# Patient Record
Sex: Male | Born: 1970 | Hispanic: No | Marital: Married | Smoking: Never smoker
Health system: Southern US, Community
[De-identification: ages and names within clinical notes are randomized; demographics above are authoritative.]

## PROBLEM LIST (undated history)

## (undated) DIAGNOSIS — T7840XA Allergy, unspecified, initial encounter: Secondary | ICD-10-CM

## (undated) DIAGNOSIS — Z8619 Personal history of other infectious and parasitic diseases: Secondary | ICD-10-CM

## (undated) HISTORY — PX: TONSILLECTOMY AND ADENOIDECTOMY: SHX28

## (undated) HISTORY — DX: Personal history of other infectious and parasitic diseases: Z86.19

## (undated) HISTORY — DX: Allergy, unspecified, initial encounter: T78.40XA

---

## 2002-03-24 ENCOUNTER — Ambulatory Visit (HOSPITAL_COMMUNITY): Admission: RE | Admit: 2002-03-24 | Discharge: 2002-03-24 | Payer: Self-pay | Admitting: General Surgery

## 2004-03-19 HISTORY — PX: HERNIA REPAIR: SHX51

## 2015-11-18 DIAGNOSIS — L918 Other hypertrophic disorders of the skin: Secondary | ICD-10-CM | POA: Insufficient documentation

## 2015-11-18 DIAGNOSIS — D239 Other benign neoplasm of skin, unspecified: Secondary | ICD-10-CM | POA: Insufficient documentation

## 2015-11-18 DIAGNOSIS — D1800 Hemangioma unspecified site: Secondary | ICD-10-CM | POA: Insufficient documentation

## 2017-03-04 ENCOUNTER — Other Ambulatory Visit: Payer: Self-pay

## 2017-03-04 ENCOUNTER — Ambulatory Visit (INDEPENDENT_AMBULATORY_CARE_PROVIDER_SITE_OTHER): Payer: BLUE CROSS/BLUE SHIELD | Admitting: Physician Assistant

## 2017-03-04 ENCOUNTER — Encounter: Payer: Self-pay | Admitting: Physician Assistant

## 2017-03-04 VITALS — BP 138/80 | HR 74 | Temp 98.3°F | Resp 14 | Ht 70.5 in | Wt 243.0 lb

## 2017-03-04 DIAGNOSIS — E669 Obesity, unspecified: Secondary | ICD-10-CM

## 2017-03-04 DIAGNOSIS — Z0000 Encounter for general adult medical examination without abnormal findings: Secondary | ICD-10-CM | POA: Diagnosis not present

## 2017-03-04 DIAGNOSIS — Z23 Encounter for immunization: Secondary | ICD-10-CM

## 2017-03-04 DIAGNOSIS — L853 Xerosis cutis: Secondary | ICD-10-CM | POA: Diagnosis not present

## 2017-03-04 LAB — LDL CHOLESTEROL, DIRECT: LDL DIRECT: 145 mg/dL

## 2017-03-04 LAB — CBC WITH DIFFERENTIAL/PLATELET
Basophils Absolute: 0 10*3/uL (ref 0.0–0.1)
Basophils Relative: 0.5 % (ref 0.0–3.0)
EOS ABS: 0.2 10*3/uL (ref 0.0–0.7)
Eosinophils Relative: 2.9 % (ref 0.0–5.0)
HEMATOCRIT: 44.6 % (ref 39.0–52.0)
HEMOGLOBIN: 15.2 g/dL (ref 13.0–17.0)
LYMPHS PCT: 32 % (ref 12.0–46.0)
Lymphs Abs: 2.5 10*3/uL (ref 0.7–4.0)
MCHC: 34.1 g/dL (ref 30.0–36.0)
MCV: 90.3 fl (ref 78.0–100.0)
MONO ABS: 0.6 10*3/uL (ref 0.1–1.0)
Monocytes Relative: 7.1 % (ref 3.0–12.0)
Neutro Abs: 4.6 10*3/uL (ref 1.4–7.7)
Neutrophils Relative %: 57.5 % (ref 43.0–77.0)
Platelets: 276 10*3/uL (ref 150.0–400.0)
RBC: 4.94 Mil/uL (ref 4.22–5.81)
RDW: 13 % (ref 11.5–15.5)
WBC: 7.9 10*3/uL (ref 4.0–10.5)

## 2017-03-04 LAB — LIPID PANEL
CHOL/HDL RATIO: 6
CHOLESTEROL: 225 mg/dL — AB (ref 0–200)
HDL: 39.9 mg/dL (ref >39.00)
NonHDL: 185.45
Triglycerides: 335 mg/dL — ABNORMAL HIGH (ref 0.0–149.0)
VLDL: 67 mg/dL — ABNORMAL HIGH (ref 0.0–40.0)

## 2017-03-04 LAB — HEMOGLOBIN A1C: Hgb A1c MFr Bld: 5.3 % (ref 4.6–6.5)

## 2017-03-04 LAB — COMPREHENSIVE METABOLIC PANEL
ALBUMIN: 4.9 g/dL (ref 3.5–5.2)
ALK PHOS: 58 U/L (ref 39–117)
ALT: 31 U/L (ref 0–53)
AST: 18 U/L (ref 0–37)
BILIRUBIN TOTAL: 0.7 mg/dL (ref 0.2–1.2)
BUN: 14 mg/dL (ref 6–23)
CO2: 30 mEq/L (ref 19–32)
CREATININE: 0.86 mg/dL (ref 0.40–1.50)
Calcium: 9.5 mg/dL (ref 8.4–10.5)
Chloride: 102 mEq/L (ref 96–112)
GFR: 101.71 mL/min (ref >60.00)
Glucose, Bld: 88 mg/dL (ref 70–99)
Potassium: 4.1 mEq/L (ref 3.5–5.1)
Sodium: 139 mEq/L (ref 135–145)
TOTAL PROTEIN: 7.6 g/dL (ref 6.0–8.3)

## 2017-03-04 MED ORDER — TRIAMCINOLONE ACETONIDE 0.1 % EX CREA: 1.0000 "application " | TOPICAL_CREAM | Freq: Two times a day (BID) | CUTANEOUS | 0 refills | Status: DC

## 2017-03-04 NOTE — Assessment & Plan Note (Signed)
Flu shot given today

## 2017-03-04 NOTE — Progress Notes (Signed)
Patient presents to clinic today to establish care. Patient requesting CPE today. Is fasting for labs.  Acute Concerns: Endorses a patch of dry and scaling skin of left cheek x 2-3 months.  Denies flaking. Denies other skin lesions. Cortisone cream helps ease it off. Denies personal history of skin cancer.   Health Maintenance: Immunizations -- Unsure of last Tetanus. Agrees to flu shot today.  HIV Screening -- Denies concern today. Declines screening.   Past Medical History:  Diagnosis Date  . Allergy   . History of chickenpox     Past Surgical History:  Procedure Laterality Date  . HERNIA REPAIR  2006  . TONSILLECTOMY AND ADENOIDECTOMY      No current outpatient medications on file prior to visit.   No current facility-administered medications on file prior to visit.     No Known Allergies  Family History  Problem Relation Age of Onset  . Depression Mother   . Cancer Mother        Skin - SCC  . Arthritis Father   . Heart disease Father   . Hyperlipidemia Father   . Hypertension Father   . Lupus Paternal Aunt   . Lung cancer Other        Paternal Grandmother    Social History   Socioeconomic History  . Marital status: Married    Spouse name: Jinny Blossom  . Number of children: 3  . Years of education: Not on file  . Highest education level: Not on file  Social Needs  . Financial resource strain: Not on file  . Food insecurity - worry: Not on file  . Food insecurity - inability: Not on file  . Transportation needs - medical: Not on file  . Transportation needs - non-medical: Not on file  Occupational History  . Occupation: Facilities manager  Tobacco Use  . Smoking status: Never Smoker  . Smokeless tobacco: Never Used  Substance and Sexual Activity  . Alcohol use: Yes    Comment: rarely  . Drug use: No  . Sexual activity: Yes  Other Topics Concern  . Not on file  Social History Narrative  . Not on file   Review of Systems  Constitutional: Negative  for fever and weight loss.  HENT: Negative for ear discharge, ear pain, hearing loss and tinnitus.   Eyes: Negative for blurred vision, double vision, photophobia and pain.  Respiratory: Negative for cough and shortness of breath.   Cardiovascular: Negative for chest pain and palpitations.  Gastrointestinal: Negative for abdominal pain, blood in stool, constipation, diarrhea, heartburn, melena, nausea and vomiting.  Genitourinary: Negative for dysuria, flank pain, frequency, hematuria and urgency.  Musculoskeletal: Negative for falls.  Neurological: Negative for dizziness, loss of consciousness and headaches.  Endo/Heme/Allergies: Negative for environmental allergies.  Psychiatric/Behavioral: Negative for depression, hallucinations, substance abuse and suicidal ideas. The patient is not nervous/anxious and does not have insomnia.    BP 138/80   Pulse 74   Temp 98.3 F (36.8 C) (Oral)   Resp 14   Ht 5' 10.5" (1.791 m)   Wt 243 lb (110.2 kg)   SpO2 97%   BMI 34.37 kg/m   Physical Exam  Constitutional: He is oriented to person, place, and time and well-developed, well-nourished, and in no distress.  HENT:  Head: Normocephalic and atraumatic.  Right Ear: External ear normal.  Left Ear: External ear normal.  Nose: Nose normal.  Mouth/Throat: Oropharynx is clear and moist. No oropharyngeal exudate.  Eyes: Conjunctivae and  EOM are normal. Pupils are equal, round, and reactive to light.  Neck: Neck supple. No thyromegaly present.  Cardiovascular: Normal rate, regular rhythm, normal heart sounds and intact distal pulses.  Pulmonary/Chest: Effort normal and breath sounds normal. No respiratory distress. He has no wheezes. He has no rales. He exhibits no tenderness.  Abdominal: Soft. Bowel sounds are normal. He exhibits no distension and no mass. There is no tenderness. There is no rebound and no guarding.  Genitourinary: Testes/scrotum normal and penis normal. No discharge found.    Lymphadenopathy:    He has no cervical adenopathy.  Neurological: He is alert and oriented to person, place, and time. No cranial nerve deficit.  Skin: Skin is warm and dry. No rash noted.     Psychiatric: Affect normal.  Vitals reviewed.  Assessment/Plan: Visit for preventive health examination Depression screen negative. Health Maintenance reviewed. Preventive schedule discussed and handout given in AVS. Will obtain fasting labs today.   Obesity (BMI 30.0-34.9) Body mass index is 34.37 kg/m. Discussed exercise and dietary recommendations. Will monitor.  Need for immunization against influenza Flu shot given today.  Dry skin OTC medications reviewed. Start Kenalog as directed. Derm if not resolving.    Leeanne Rio, PA-C

## 2017-03-04 NOTE — Assessment & Plan Note (Signed)
Body mass index is 34.37 kg/m. Discussed exercise and dietary recommendations. Will monitor.

## 2017-03-04 NOTE — Patient Instructions (Signed)
Please go to the lab for blood work.   Our office will call you with your results unless you have chosen to receive results via MyChart.  If your blood work is normal we will follow-up each year for physicals and as scheduled for chronic medical problems.  If anything is abnormal we will treat accordingly and get you in for a follow-up.  Start the Kenalog cream twice daily for up to 2 weeks. Get a good facial moisturizer and use daily. If spot is not clearing up, let me know as I would want you to see a Dermatologist.    Preventive Care 40-64 Years, Male Preventive care refers to lifestyle choices and visits with your health care provider that can promote health and wellness. What does preventive care include?  A yearly physical exam. This is also called an annual well check.  Dental exams once or twice a year.  Routine eye exams. Ask your health care provider how often you should have your eyes checked.  Personal lifestyle choices, including: ? Daily care of your teeth and gums. ? Regular physical activity. ? Eating a healthy diet. ? Avoiding tobacco and drug use. ? Limiting alcohol use. ? Practicing safe sex. ? Taking low-dose aspirin every day starting at age 21. What happens during an annual well check? The services and screenings done by your health care provider during your annual well check will depend on your age, overall health, lifestyle risk factors, and family history of disease. Counseling Your health care provider may ask you questions about your:  Alcohol use.  Tobacco use.  Drug use.  Emotional well-being.  Home and relationship well-being.  Sexual activity.  Eating habits.  Work and work Statistician.  Screening You may have the following tests or measurements:  Height, weight, and BMI.  Blood pressure.  Lipid and cholesterol levels. These may be checked every 5 years, or more frequently if you are over 72 years old.  Skin check.  Lung  cancer screening. You may have this screening every year starting at age 6 if you have a 30-pack-year history of smoking and currently smoke or have quit within the past 15 years.  Fecal occult blood test (FOBT) of the stool. You may have this test every year starting at age 68.  Flexible sigmoidoscopy or colonoscopy. You may have a sigmoidoscopy every 5 years or a colonoscopy every 10 years starting at age 75.  Prostate cancer screening. Recommendations will vary depending on your family history and other risks.  Hepatitis C blood test.  Hepatitis B blood test.  Sexually transmitted disease (STD) testing.  Diabetes screening. This is done by checking your blood sugar (glucose) after you have not eaten for a while (fasting). You may have this done every 1-3 years.  Discuss your test results, treatment options, and if necessary, the need for more tests with your health care provider. Vaccines Your health care provider may recommend certain vaccines, such as:  Influenza vaccine. This is recommended every year.  Tetanus, diphtheria, and acellular pertussis (Tdap, Td) vaccine. You may need a Td booster every 10 years.  Varicella vaccine. You may need this if you have not been vaccinated.  Zoster vaccine. You may need this after age 24.  Measles, mumps, and rubella (MMR) vaccine. You may need at least one dose of MMR if you were born in 1957 or later. You may also need a second dose.  Pneumococcal 13-valent conjugate (PCV13) vaccine. You may need this if you have certain  conditions and have not been vaccinated.  Pneumococcal polysaccharide (PPSV23) vaccine. You may need one or two doses if you smoke cigarettes or if you have certain conditions.  Meningococcal vaccine. You may need this if you have certain conditions.  Hepatitis A vaccine. You may need this if you have certain conditions or if you travel or work in places where you may be exposed to hepatitis A.  Hepatitis B vaccine.  You may need this if you have certain conditions or if you travel or work in places where you may be exposed to hepatitis B.  Haemophilus influenzae type b (Hib) vaccine. You may need this if you have certain risk factors.  Talk to your health care provider about which screenings and vaccines you need and how often you need them. This information is not intended to replace advice given to you by your health care provider. Make sure you discuss any questions you have with your health care provider. Document Released: 04/01/2015 Document Revised: 11/23/2015 Document Reviewed: 01/04/2015 Elsevier Interactive Patient Education  2017 [employer]. .

## 2017-03-04 NOTE — Assessment & Plan Note (Signed)
Depression screen negative. Health Maintenance reviewed. Preventive schedule discussed and handout given in AVS. Will obtain fasting labs today.  

## 2017-03-04 NOTE — Assessment & Plan Note (Signed)
OTC medications reviewed. Start Kenalog as directed. Derm if not resolving.

## 2017-03-30 ENCOUNTER — Ambulatory Visit (INDEPENDENT_AMBULATORY_CARE_PROVIDER_SITE_OTHER): Payer: Self-pay | Admitting: Family

## 2017-03-30 ENCOUNTER — Encounter: Payer: Self-pay | Admitting: Family

## 2017-03-30 VITALS — BP 136/82 | HR 74 | Temp 98.4°F | Resp 18 | Wt 239.6 lb

## 2017-03-30 DIAGNOSIS — J029 Acute pharyngitis, unspecified: Secondary | ICD-10-CM

## 2017-03-30 LAB — POCT RAPID STREP A (OFFICE): Rapid Strep A Screen: NEGATIVE

## 2017-03-30 MED ORDER — AZITHROMYCIN 250 MG PO TABS: ORAL_TABLET | ORAL | 0 refills | Status: DC

## 2017-03-30 NOTE — Progress Notes (Signed)
Subjective:     Patient ID: Brian Sellers, male   DOB: 1970-04-07, 47 y.o.   MRN: [id number]  HPI 47 year old male is in today with c/o persistent sore throat since doing a [facility] visit on 03/19/17. He continues to take Ibuprofen every 4-hours for relief. Has minimal drainage and congestion. No fever.   Review of Systems  Constitutional: Negative.   HENT: Positive for congestion, postnasal drip and sore throat.   Respiratory: Positive for cough. Negative for chest tightness, shortness of breath and wheezing.   Cardiovascular: Negative.   Musculoskeletal: Negative.   Skin: Negative.   Allergic/Immunologic: Negative.   Neurological: Negative.   Psychiatric/Behavioral: Negative.    Past Medical History:  Diagnosis Date  . Allergy   . History of chickenpox     Social History   Socioeconomic History  . Marital status: Married    Spouse name: Jinny Blossom  . Number of children: 3  . Years of education: Not on file  . Highest education level: Not on file  Social Needs  . Financial resource strain: Not on file  . Food insecurity - worry: Not on file  . Food insecurity - inability: Not on file  . Transportation needs - medical: Not on file  . Transportation needs - non-medical: Not on file  Occupational History  . Occupation: Facilities manager  Tobacco Use  . Smoking status: Never Smoker  . Smokeless tobacco: Never Used  Substance and Sexual Activity  . Alcohol use: Yes    Comment: rarely  . Drug use: No  . Sexual activity: Yes  Other Topics Concern  . Not on file  Social History Narrative  . Not on file    Past Surgical History:  Procedure Laterality Date  . HERNIA REPAIR  2006  . TONSILLECTOMY AND ADENOIDECTOMY      Family History  Problem Relation Age of Onset  . Depression Mother   . Cancer Mother        Skin - SCC  . Arthritis Father   . Heart disease Father   . Hyperlipidemia Father   . Hypertension Father   . Lupus Paternal Aunt   . Lung cancer Other    Paternal Grandmother    No Known Allergies  Current Outpatient Medications on File Prior to Visit  Medication Sig Dispense Refill  . triamcinolone cream (KENALOG) 0.1 % Apply 1 application topically 2 (two) times daily. For 2 weeks. 30 g 0   No current facility-administered medications on file prior to visit.     BP 136/82 (BP Location: Right Arm, Patient Position: Sitting, Cuff Size: Large)   Pulse 74   Temp 98.4 F (36.9 C) (Oral)   Resp 18   Wt 239 lb 9.6 oz (108.7 kg)   SpO2 97%   BMI 33.89 kg/m chart    Objective:   Physical Exam     Assessment:     Brian Sellers was seen today for sore throat.  Diagnoses and all orders for this visit:  Pharyngitis, unspecified etiology -     POCT rapid strep A  Other orders -     azithromycin (ZITHROMAX) 250 MG tablet; 2 tabs today then 1 tab daily x 4 more days      Plan:     OTC decongestant. Call the office with any questions or concerns.

## 2017-03-30 NOTE — Patient Instructions (Signed)

## 2017-04-01 ENCOUNTER — Telehealth: Payer: Self-pay

## 2017-04-01 NOTE — Telephone Encounter (Signed)
Patients states he is great.

## 2019-03-24 ENCOUNTER — Emergency Department (HOSPITAL_BASED_OUTPATIENT_CLINIC_OR_DEPARTMENT_OTHER): Payer: No Typology Code available for payment source

## 2019-03-24 ENCOUNTER — Other Ambulatory Visit: Payer: Self-pay

## 2019-03-24 ENCOUNTER — Encounter (HOSPITAL_BASED_OUTPATIENT_CLINIC_OR_DEPARTMENT_OTHER): Payer: Self-pay | Admitting: Emergency Medicine

## 2019-03-24 ENCOUNTER — Emergency Department (HOSPITAL_BASED_OUTPATIENT_CLINIC_OR_DEPARTMENT_OTHER)
Admission: EM | Admit: 2019-03-24 | Discharge: 2019-03-24 | Disposition: A | Discharge status: Home / Self Care | Payer: No Typology Code available for payment source | Attending: Emergency Medicine | Admitting: Emergency Medicine

## 2019-03-24 DIAGNOSIS — R1032 Left lower quadrant pain: Secondary | ICD-10-CM | POA: Diagnosis present

## 2019-03-24 DIAGNOSIS — N200 Calculus of kidney: Secondary | ICD-10-CM | POA: Insufficient documentation

## 2019-03-24 LAB — BASIC METABOLIC PANEL
Anion gap: 12 (ref 5–15)
BUN: 22 mg/dL — ABNORMAL HIGH (ref 6–20)
CO2: 23 mmol/L (ref 22–32)
Calcium: 9.4 mg/dL (ref 8.9–10.3)
Chloride: 105 mmol/L (ref 98–111)
Creatinine, Ser: 0.94 mg/dL (ref 0.61–1.24)
GFR calc Af Amer: >60 mL/min (ref >60)
GFR calc non Af Amer: >60 mL/min (ref >60)
Glucose, Bld: 132 mg/dL — ABNORMAL HIGH (ref 70–99)
Potassium: 4 mmol/L (ref 3.5–5.1)
Sodium: 140 mmol/L (ref 135–145)

## 2019-03-24 LAB — URINALYSIS, ROUTINE W REFLEX MICROSCOPIC
Bilirubin Urine: NEGATIVE
Glucose, UA: NEGATIVE mg/dL
Ketones, ur: NEGATIVE mg/dL
Leukocytes,Ua: NEGATIVE
Nitrite: NEGATIVE
Protein, ur: NEGATIVE mg/dL
Specific Gravity, Urine: >1.03 — ABNORMAL HIGH (ref 1.005–1.030)
pH: 5.5 (ref 5.0–8.0)

## 2019-03-24 LAB — CBC WITH DIFFERENTIAL/PLATELET
Abs Immature Granulocytes: 0.05 10*3/uL (ref 0.00–0.07)
Basophils Absolute: 0 10*3/uL (ref 0.0–0.1)
Basophils Relative: 0 %
Eosinophils Absolute: 0.1 10*3/uL (ref 0.0–0.5)
Eosinophils Relative: 1 %
HCT: 44.9 % (ref 39.0–52.0)
Hemoglobin: 15.4 g/dL (ref 13.0–17.0)
Immature Granulocytes: 0 %
Lymphocytes Relative: 16 %
Lymphs Abs: 2 10*3/uL (ref 0.7–4.0)
MCH: 30.4 pg (ref 26.0–34.0)
MCHC: 34.3 g/dL (ref 30.0–36.0)
MCV: 88.7 fL (ref 80.0–100.0)
Monocytes Absolute: 0.8 10*3/uL (ref 0.1–1.0)
Monocytes Relative: 7 %
Neutro Abs: 9.2 10*3/uL — ABNORMAL HIGH (ref 1.7–7.7)
Neutrophils Relative %: 76 %
Platelets: 293 10*3/uL (ref 150–400)
RBC: 5.06 MIL/uL (ref 4.22–5.81)
RDW: 12.3 % (ref 11.5–15.5)
WBC: 12.2 10*3/uL — ABNORMAL HIGH (ref 4.0–10.5)
nRBC: 0 % (ref 0.0–0.2)

## 2019-03-24 LAB — URINALYSIS, MICROSCOPIC (REFLEX)

## 2019-03-24 MED ORDER — HYDROCODONE-ACETAMINOPHEN 5-325 MG PO TABS: 1.0000 | ORAL_TABLET | Freq: Four times a day (QID) | ORAL | 0 refills | Status: DC | PRN

## 2019-03-24 MED ORDER — HYDROMORPHONE HCL 1 MG/ML IJ SOLN
0.5000 mg | Freq: Once | INTRAMUSCULAR | Status: AC
  Administered 2019-03-24: 12:00:00 0.5 mg via INTRAVENOUS
  Filled 2019-03-24: qty 1

## 2019-03-24 MED ORDER — ONDANSETRON HCL 4 MG/2ML IJ SOLN
4.0000 mg | Freq: Once | INTRAMUSCULAR | Status: AC
  Administered 2019-03-24: 09:00:00 4 mg via INTRAVENOUS
  Filled 2019-03-24: qty 2

## 2019-03-24 MED ORDER — HYDROMORPHONE HCL 1 MG/ML IJ SOLN
0.5000 mg | Freq: Once | INTRAMUSCULAR | Status: AC
  Administered 2019-03-24: 10:00:00 0.5 mg via INTRAVENOUS
  Filled 2019-03-24: qty 1

## 2019-03-24 MED ORDER — HYDROMORPHONE HCL 1 MG/ML IJ SOLN
0.5000 mg | Freq: Once | INTRAMUSCULAR | Status: AC
  Administered 2019-03-24: 0.5 mg via INTRAVENOUS
  Filled 2019-03-24: qty 1

## 2019-03-24 MED ORDER — HYDROCODONE-ACETAMINOPHEN 5-325 MG PO TABS
1.0000 | ORAL_TABLET | Freq: Once | ORAL | Status: AC
  Administered 2019-03-24: 12:00:00 1 via ORAL
  Filled 2019-03-24: qty 1

## 2019-03-24 MED ORDER — KETOROLAC TROMETHAMINE 15 MG/ML IJ SOLN
15.0000 mg | Freq: Once | INTRAMUSCULAR | Status: AC
  Administered 2019-03-24: 11:00:00 15 mg via INTRAVENOUS
  Filled 2019-03-24: qty 1

## 2019-03-24 MED FILL — HYDROCODON-APAP 5-325: 5-325 | 2 days supply | Qty: 15 | Fill #0

## 2019-03-24 NOTE — ED Notes (Signed)
Family at bedside. 

## 2019-03-24 NOTE — ED Notes (Signed)
Pt reports unable to give urine specimen at this time. 

## 2019-03-24 NOTE — ED Notes (Signed)
Pt aware we need urine specimen, urinal provided

## 2019-03-24 NOTE — ED Notes (Signed)
Pts wife Jinny Blossom called regarding pts status. Pt gave verbal permission to discuss care. Updated family regarding status.

## 2019-03-24 NOTE — ED Provider Notes (Signed)
[facility] Provider Note   CSN: [id number] Arrival date & time: 03/24/19  0754     History Chief Complaint  Patient presents with  . Abdominal Pain    Nohlan Bauguess is a 49 y.o. male.  The history is provided by the patient.  Abdominal Pain Pain location:  L flank and LLQ Pain quality: aching, sharp, shooting and stabbing   Pain radiates to:  LLQ Pain severity:  Severe Onset quality:  Sudden Duration:  1 hour Timing:  Constant Progression:  Unchanged Chronicity:  New Context comment:  Woke up this morning feeling fine had a bowel movement and urinated without difficulty and as he was walking back to the bedroom the pain started suddenly and has not relieved Relieved by:  Nothing Worsened by:  Nothing Ineffective treatments:  NSAIDs Associated symptoms: nausea   Associated symptoms: no anorexia, no cough, no diarrhea, no dysuria, no fever, no hematuria and no vomiting   Risk factors comment:  Takes no medications and only medical history is of obesity      Past Medical History:  Diagnosis Date  . Allergy   . History of chickenpox     Patient Active Problem List   Diagnosis Date Noted  . Visit for preventive health examination 03/04/2017  . Obesity (BMI 30.0-34.9) 03/04/2017  . Dry skin 03/04/2017  . Need for immunization against influenza 03/04/2017  . 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 Dysplastic nevi 11/18/2015    Past Surgical History:  Procedure Laterality Date  . HERNIA REPAIR  2006  . TONSILLECTOMY AND ADENOIDECTOMY         Family History  Problem Relation Age of Onset  . Depression Mother   . Cancer Mother        Skin - SCC  . Arthritis Father   . Heart disease Father   . Hyperlipidemia Father   . Hypertension Father   . Lupus Paternal Aunt   . Lung cancer Other        Paternal Grandmother    Social History   Tobacco Use  . Smoking status: Never Smoker  . Smokeless tobacco: Never Used  Substance Use  Topics  . Alcohol use: Yes    Comment: rarely  . Drug use: No    Home Medications Prior to Admission medications   Medication Sig Start Date End Date Taking? Authorizing Provider  azithromycin (ZITHROMAX) 250 MG tablet 2 tabs today then 1 tab daily x 4 more days 03/30/17   Dutch Quint B, FNP  triamcinolone cream (KENALOG) 0.1 % Apply 1 application topically 2 (two) times daily. For 2 weeks. 03/04/17   Brunetta Jeans, PA-C    Allergies    Patient has no known allergies.  Review of Systems   Review of Systems  Constitutional: Negative for fever.  Respiratory: Negative for cough.   Gastrointestinal: Positive for abdominal pain and nausea. Negative for anorexia, diarrhea and vomiting.  Genitourinary: Negative for dysuria and hematuria.  All other systems reviewed and are negative.   Physical Exam Updated Vital Signs BP (!) 143/90 (BP Location: Right Arm)   Pulse 73   Temp 98 F (36.7 C) (Oral)   Resp 16   Ht 5\' 11"  (1.803 m)   Wt 102.1 kg   SpO2 100%   BMI 31.38 kg/m   Physical Exam Vitals and nursing note reviewed.  Constitutional:      General: He is not in acute distress.    Appearance: He is well-developed.  HENT:  Head: Normocephalic and atraumatic.  Eyes:     Conjunctiva/sclera: Conjunctivae normal.     Pupils: Pupils are equal, round, and reactive to light.  Cardiovascular:     Rate and Rhythm: Normal rate and regular rhythm.     Heart sounds: No murmur.  Pulmonary:     Effort: Pulmonary effort is normal. No respiratory distress.     Breath sounds: Normal breath sounds. No wheezing or rales.  Abdominal:     General: There is no distension.     Palpations: Abdomen is soft.     Tenderness: There is abdominal tenderness in the left lower quadrant. There is left CVA tenderness. There is no guarding or rebound.     Hernia: No hernia is present.  Musculoskeletal:        General: No tenderness. Normal range of motion.     Cervical back: Normal range of  motion and neck supple.  Skin:    General: Skin is warm and dry.     Findings: No erythema or rash.  Neurological:     General: No focal deficit present.     Mental Status: He is alert and oriented to person, place, and time. Mental status is at baseline.  Psychiatric:        Mood and Affect: Mood normal.        Behavior: Behavior normal.        Thought Content: Thought content normal.     ED Results / Procedures / Treatments   Labs (all labs ordered are listed, but only abnormal results are displayed) Labs Reviewed  CBC WITH DIFFERENTIAL/PLATELET - Abnormal; Notable for the following components:      Result Value   WBC 12.2 (*)    Neutro Abs 9.2 (*)    All other components within normal limits  BASIC METABOLIC PANEL - Abnormal; Notable for the following components:   Glucose, Bld 132 (*)    BUN 22 (*)    All other components within normal limits  URINALYSIS, ROUTINE W REFLEX MICROSCOPIC    EKG None  Radiology CT Renal Stone Study  Result Date: 03/24/2019 CLINICAL DATA:  Left flank pain and nausea. EXAM: CT ABDOMEN AND PELVIS WITHOUT CONTRAST TECHNIQUE: Multidetector CT imaging of the abdomen and pelvis was performed following the standard protocol without IV contrast. COMPARISON:  None. FINDINGS: Lower chest: No acute abnormality. Hepatobiliary: No focal liver abnormality is seen. No gallstones, gallbladder wall thickening, or biliary dilatation. Pancreas: Unremarkable. No pancreatic ductal dilatation or surrounding inflammatory changes. Spleen: Normal in size without focal abnormality. Adrenals/Urinary Tract: There is a 4 mm stone obstructing the left ureteropelvic junction creating slight left hydronephrosis and minimal soft tissue stranding around the left renal pelvis. 1 mm stone in the mid left kidney. The kidneys are otherwise normal. Adrenal glands and bladder are normal. Stomach/Bowel: Stomach is within normal limits. Appendix appears normal. No evidence of bowel wall  thickening, distention, or inflammatory changes. Vascular/Lymphatic: No significant vascular findings are present. No enlarged abdominal or pelvic lymph nodes. Reproductive: Prostate is unremarkable. Other: No ascites. Previous right inguinal hernia repair with mesh in place. Musculoskeletal: No acute or significant osseous findings. IMPRESSION: 1. 4 mm stone obstructing the left ureteropelvic junction causing slight left hydronephrosis. 2. 1 mm stone in the mid left kidney. Electronically Signed   By: Lorriane Shire M.D.   On: 03/24/2019 09:07    Procedures Procedures (including critical care time)  Medications Ordered in ED Medications  HYDROmorphone (DILAUDID) injection 0.5 mg (has  no administration in time range)  ondansetron (ZOFRAN) injection 4 mg (has no administration in time range)    ED Course  I have reviewed the triage vital signs and the nursing notes.  Pertinent labs & imaging results that were available during my care of the patient were reviewed by me and considered in my medical decision making (see chart for details).    MDM Rules/Calculators/A&P                      Pt with symptoms consistent with kidney stone.  Denies infectious sx, or GI symptoms.  Low concern for diverticulitis and no risk factors or history suggestive of AAA.  No hx suggestive of GU source (discharge) and otherwise pt is healthy.  Will treat pain and ensure no infection with UA, CBC, BMP and will get stone study to further eval.  9:57 AM CBC with nonspecific leukocytosis of 12 but otherwise normal and BMP within normal limits.  CT shows 4 mm UVJ stone with mild hydronephrosis.  Suspect this is the cause of the patient's pain.  On reevaluation patient is still having 6 out of 10 pain and he was given another dose of pain medication including Toradol.  Will reevaluate.  Still waiting on urine.  12:13 PM UA with hemoglobin but no other acute findings.  After Toradol patient was feeling much better down  to 1 out of 10 but states in the last 20 minutes now his pain is a 5 out of 10 and is starting to increase.  Suspect stone is most likely moving.  Patient given 1 more dose of pain control.  Will be discharged home with pain control and urology follow-up as needed.   Final Clinical Impression(s) / ED Diagnoses Final diagnoses:  Kidney stone    Rx / DC Orders ED Discharge Orders         Ordered    HYDROcodone-acetaminophen (NORCO/VICODIN) 5-325 MG tablet  Every 6 hours PRN     03/24/19 1215           Blanchie Dessert, MD 03/24/19 1216

## 2019-03-24 NOTE — Discharge Instructions (Signed)
Take the pain medication regularly today so your pain does not become out of control.  Make sure you are drinking plenty of fluids.  If you develop severe pain, vomiting that you cannot control or you start developing a fever you need to return to the emergency room.

## 2019-03-24 NOTE — ED Notes (Signed)
ED Provider at bedside. 

## 2019-03-24 NOTE — ED Triage Notes (Addendum)
Pt c/o acute onset LLQ pain since 0650 today. Pt denies fever, reports nausea, denies emesis, deneis diarrhea

## 2019-03-27 ENCOUNTER — Encounter: Payer: Self-pay | Admitting: Physician Assistant

## 2019-03-27 ENCOUNTER — Other Ambulatory Visit: Payer: Self-pay

## 2019-03-27 ENCOUNTER — Ambulatory Visit (INDEPENDENT_AMBULATORY_CARE_PROVIDER_SITE_OTHER): Payer: No Typology Code available for payment source | Admitting: Physician Assistant

## 2019-03-27 DIAGNOSIS — N132 Hydronephrosis with renal and ureteral calculous obstruction: Secondary | ICD-10-CM

## 2019-03-27 DIAGNOSIS — N200 Calculus of kidney: Secondary | ICD-10-CM | POA: Diagnosis not present

## 2019-03-27 MED ORDER — HYDROCODONE-ACETAMINOPHEN 5-325 MG PO TABS: 1.0000 | ORAL_TABLET | Freq: Four times a day (QID) | ORAL | 0 refills | Status: AC | PRN

## 2019-03-27 NOTE — Patient Instructions (Addendum)
Instructions sent to MyChart.   Please keep well-hydrated. Get the strainer at the pharmacy to strain urine -- so we can check for stones I have refilled your pain medication for now.  You will be called by the Urologist very soon to schedule an appointment as we have just gotten off of the phone with them.  I encourage you to increase hydration and the amount of fiber in your diet.  Start a daily probiotic (Align, Culturelle, Digestive Advantage, etc.). If no bowel movement within 24 hours, take 2 Tbs of Milk of Magnesia in a 4 oz glass of warmed prune juice every 2-3 days to help promote bowel movement. If no results within 24 hours, then repeat above regimen, adding a Dulcolax stool softener to regimen. If this does not promote a bowel movement, please call the office.

## 2019-03-27 NOTE — Progress Notes (Signed)
I have discussed the procedure for the virtual visit with the patient who has given consent to proceed with assessment and treatment.   Declan Mier S Shayma Pfefferle, CMA     

## 2019-03-27 NOTE — Progress Notes (Signed)
   Virtual Visit via Video   I connected with patient on 03/27/19 at 10:30 AM EST by a video enabled telemedicine application and verified that I am speaking with the correct person using two identifiers.  Location patient: Home Location provider: [facility] Office Persons participating in the virtual visit: Patient, Provider, [place] (Patina Moore)  I discussed the limitations of evaluation and management by telemedicine and the availability of in person appointments. The patient expressed understanding and agreed to proceed.  Subjective:   HPI:        Patient presents via [employer] today for ER follow-up. Patient presented to ER on 03/24/2019 with c/o flank pain. ER visit and discharge summary, along with imaging reviewed in detail. ER workup included labs and imaging. CT Renal study revealed 4 mm obstructive L ureteropelvic junction with nephrosis.. Patient was given Rx hydrocodone and instruction to follow-up OP with Urology.       Since discharge, notes ongoing pain, helped with pain medication but causing significant constipation. Notes good urinary output without hesitancy or noted hematuria. Has not passed any of the stone to his knowledge but not straining urine. Denies fever, chills.   ROS:   See pertinent positives and negatives per HPI.  Patient Active Problem List   Diagnosis Date Noted  . Visit for preventive health examination 03/04/2017  . Obesity (BMI 30.0-34.9) 03/04/2017  . Dry skin 03/04/2017  . Need for immunization against influenza 03/04/2017  . 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 Dysplastic nevi 11/18/2015    Social History   Tobacco Use  . Smoking status: Never Smoker  . Smokeless tobacco: Never Used  Substance Use Topics  . Alcohol use: Yes    Comment: rarely    Current Outpatient Medications:  .  HYDROcodone-acetaminophen (NORCO/VICODIN) 5-325 MG tablet, Take 1-2 tablets by mouth every 6 (six) hours as needed., Disp: 15 tablet, Rfl: 0  No Known  Allergies  Objective:   There were no vitals taken for this visit.  Patient is well-developed, well-nourished in no acute distress.  Resting comfortably at home.  Head is normocephalic, atraumatic.  No labored breathing.  Speech is clear and coherent with logical content.  Patient is alert and oriented at baseline.   Assessment and Plan:   1. Nephrolithiasis 2. Hydronephrosis with urinary obstruction due to renal calculus 4 mm obstructive stone with hydronephrosis. Having continual pain. No passage of stone pieces yet. Constipation 2/2 opioid use for pain control. Increase fluids. Pain med refilled. Bowel regimen reviewed. STAT referral placed to Urology for further assessment and management. Unsure if they will be ok with MET or if they feel something further is needed. Discussed typically 4 mm stone can be passed but since he has hydronephrosis on CT already, days ago, want Urology intervention.  - Ambulatory referral to Urology    Leeanne Rio, PA-C 03/27/2019

## 2020-07-20 ENCOUNTER — Telehealth: Payer: Self-pay

## 2020-07-20 NOTE — Telephone Encounter (Signed)
LVM advising patient to call back and schedule TOC. 

## 2020-12-09 IMAGING — CT CT RENAL STONE PROTOCOL
2 of 4 series · 16 of 46 positions shown, 18 images · non-contrast
Comparison: None.

CLINICAL DATA: Left flank pain and nausea.

EXAM:
CT ABDOMEN AND PELVIS WITHOUT CONTRAST
TECHNIQUE: Multidetector CT imaging of the abdomen and pelvis was performed
following the standard protocol without IV contrast.

[Series 2: axial st · axial · 0.97mm/px · z∈[-531,-36]mm · 13 of 109 slices shown, 15 images]
[im 5/109  soft-tissue]
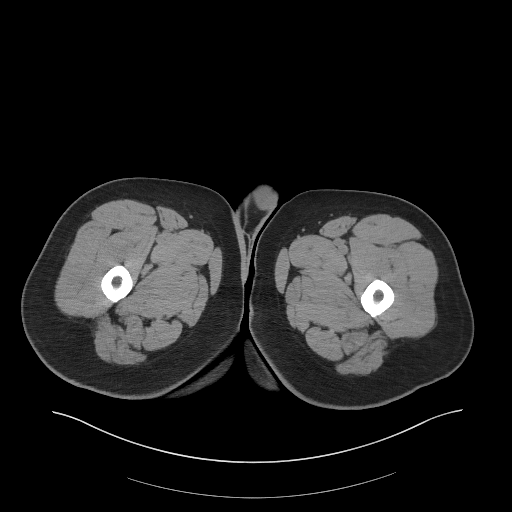
[im 5/109  bone]
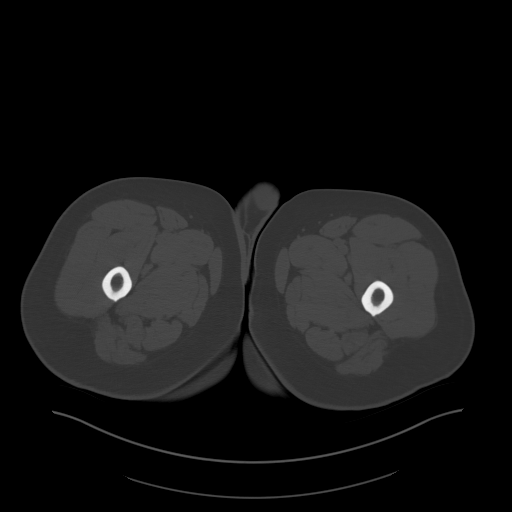
[im 13/109  soft-tissue]
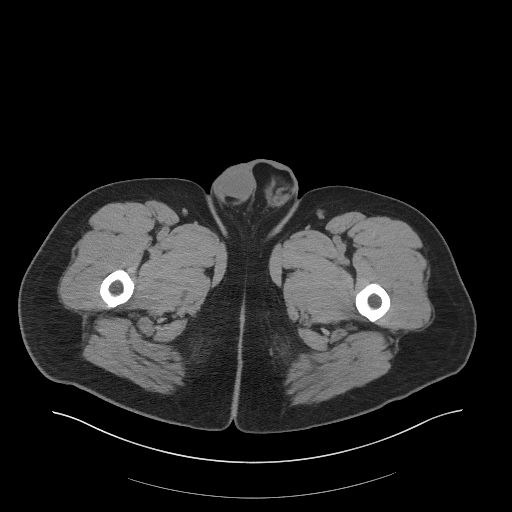
[im 22/109  soft-tissue]
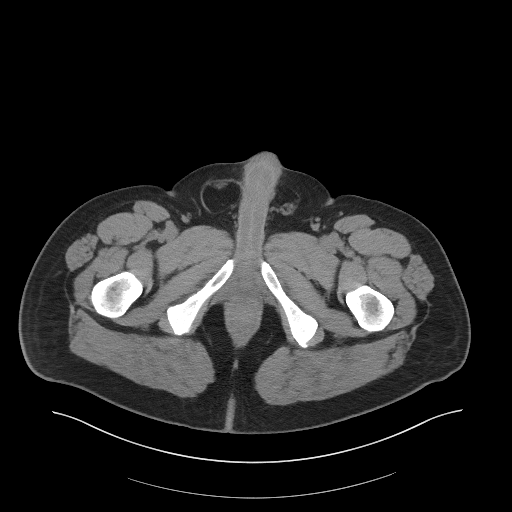
[im 31/109  soft-tissue]
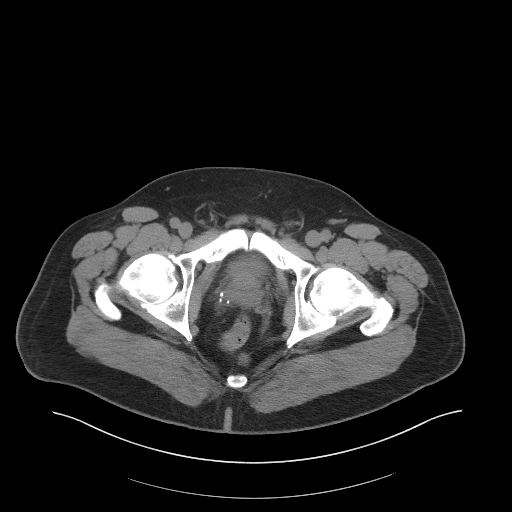
[im 39/109  soft-tissue]
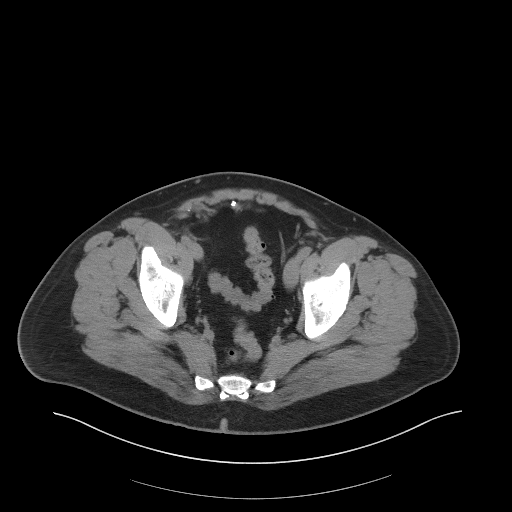
[im 48/109  soft-tissue]
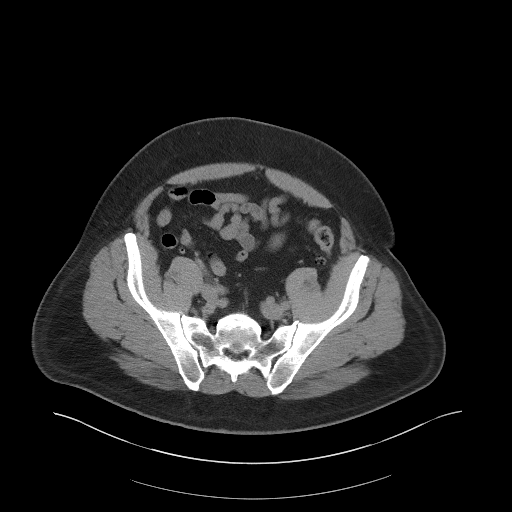
[im 57/109  soft-tissue]
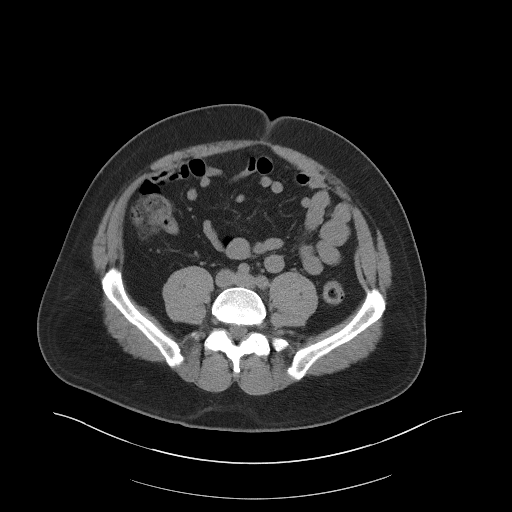
[im 61/109  soft-tissue]
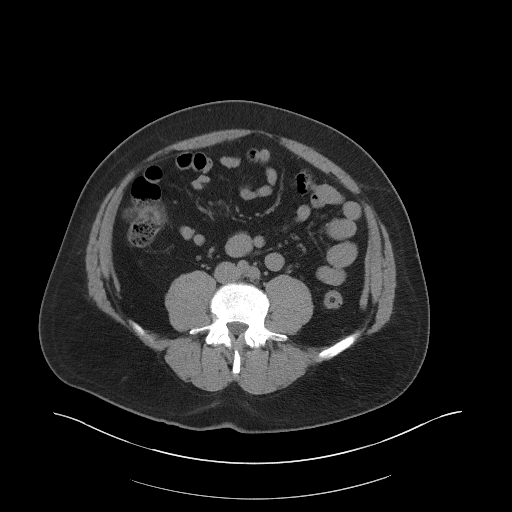
[im 70/109  soft-tissue]
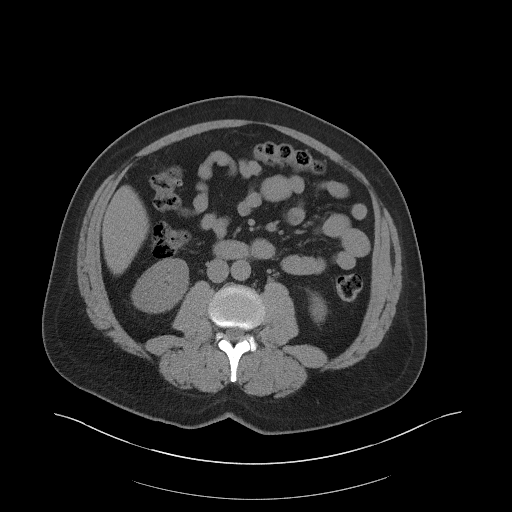
[im 70/109  bone]
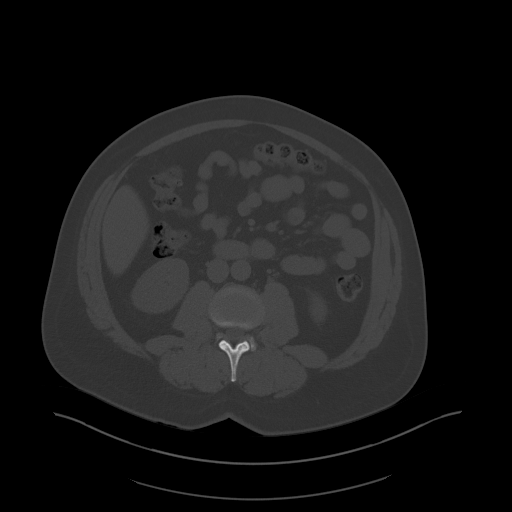
[im 78/109  soft-tissue]
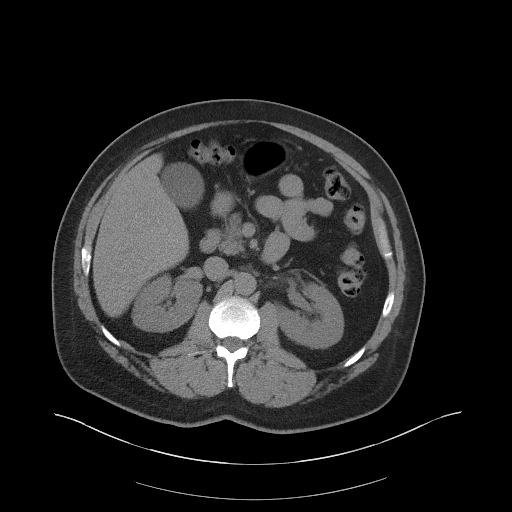
[im 87/109  soft-tissue]
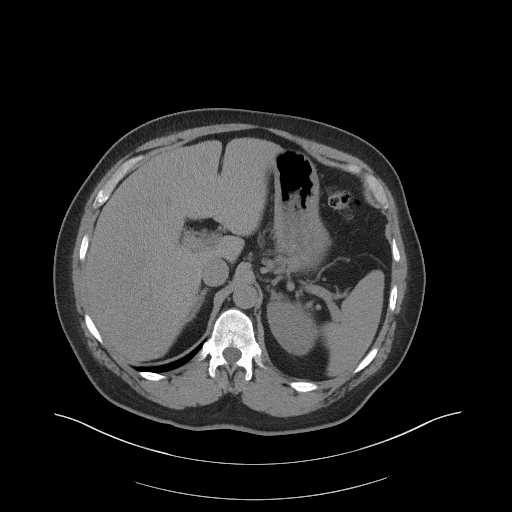
[im 96/109  soft-tissue]
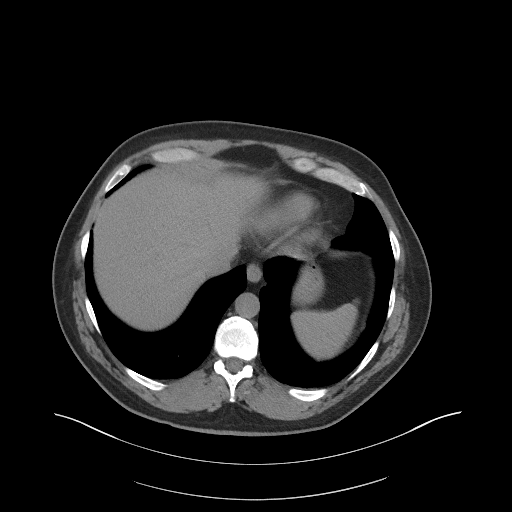
[im 104/109  soft-tissue]
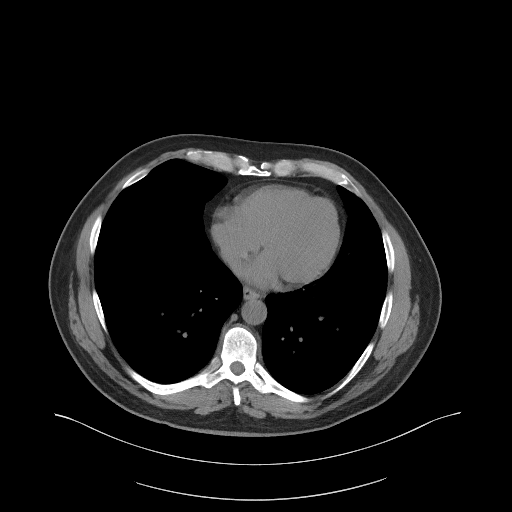

[Series 4: coronal st · coronal · 0.93mm/px · 3 of 112 slices shown]
[im 38/112  soft-tissue]
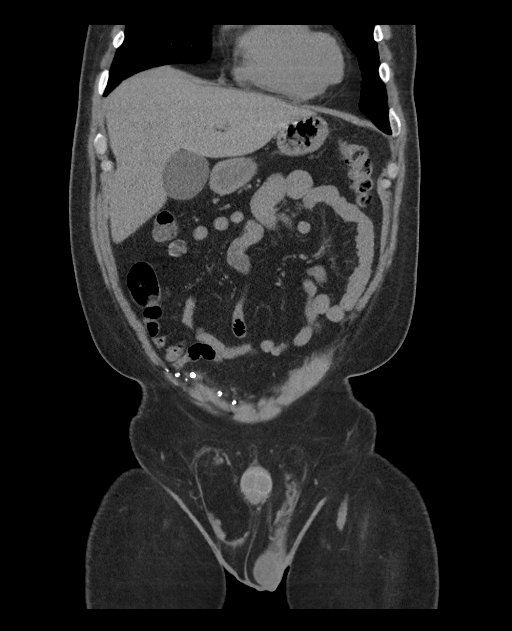
[im 50/112  soft-tissue]
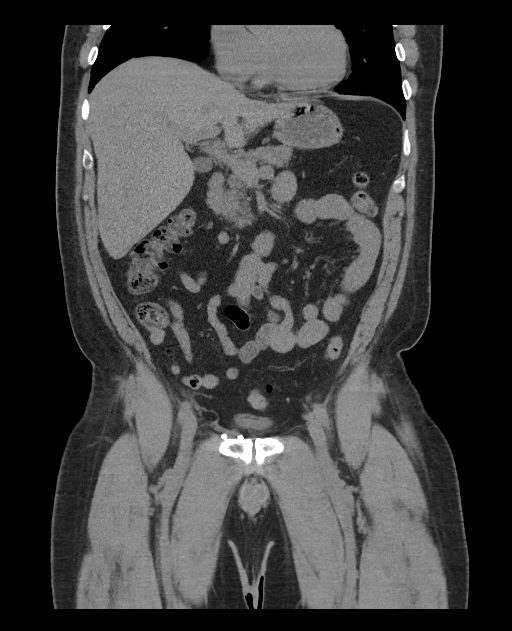
[im 62/112  soft-tissue]
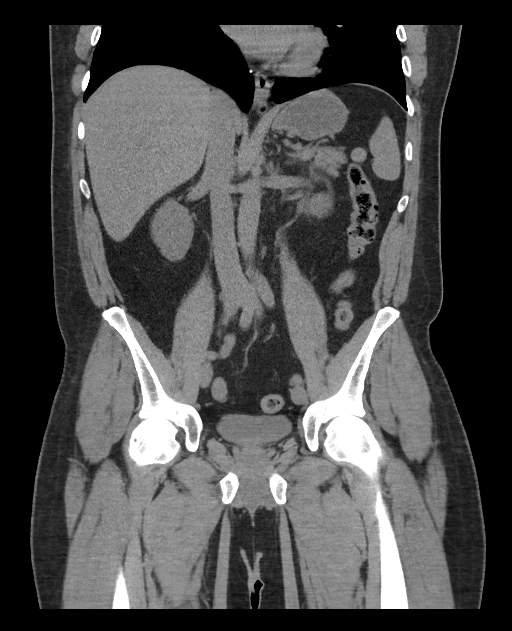

[16 of 46 positions shown; findings below may reference images not displayed]

FINDINGS: Lower chest: No acute abnormality.

Hepatobiliary: No focal liver abnormality is seen. No gallstones,
gallbladder wall thickening, or biliary dilatation.

Pancreas: Unremarkable. No pancreatic ductal dilatation or
surrounding inflammatory changes.

Spleen: Normal in size without focal abnormality.

Adrenals/Urinary Tract: There is a 4 mm stone obstructing the left
ureteropelvic junction creating slight left hydronephrosis and
minimal soft tissue stranding around the left renal pelvis. 1 mm
stone in the mid left kidney. The kidneys are otherwise normal.
Adrenal glands and bladder are normal.

Stomach/Bowel: Stomach is within normal limits. Appendix appears
normal. No evidence of bowel wall thickening, distention, or
inflammatory changes.

Vascular/Lymphatic: No significant vascular findings are present. No
enlarged abdominal or pelvic lymph nodes.

Reproductive: Prostate is unremarkable.

Other: No ascites. Previous right inguinal hernia repair with mesh
in place.

Musculoskeletal: No acute or significant osseous findings.
IMPRESSION: 1. 4 mm stone obstructing the left ureteropelvic junction causing
slight left hydronephrosis.
2. 1 mm stone in the mid left kidney.
